# Patient Record
Sex: Male | Born: 1950 | Race: White | Hispanic: No | Marital: Married | Smoking: Former smoker
Health system: Southern US, Community
[De-identification: ages and names within clinical notes are randomized; demographics above are authoritative.]

## PROBLEM LIST (undated history)

## (undated) HISTORY — PX: ABDOMINAL SURGERY: SHX537

## (undated) HISTORY — PX: HERNIA REPAIR: SHX51

---

## 2017-12-14 ENCOUNTER — Emergency Department (HOSPITAL_BASED_OUTPATIENT_CLINIC_OR_DEPARTMENT_OTHER)
Admission: EM | Admit: 2017-12-14 | Discharge: 2017-12-14 | Disposition: A | Discharge status: Home / Self Care | Payer: BC Managed Care – PPO | Attending: Emergency Medicine | Admitting: Emergency Medicine

## 2017-12-14 ENCOUNTER — Encounter (HOSPITAL_BASED_OUTPATIENT_CLINIC_OR_DEPARTMENT_OTHER): Payer: Self-pay

## 2017-12-14 ENCOUNTER — Other Ambulatory Visit: Payer: Self-pay

## 2017-12-14 ENCOUNTER — Emergency Department (HOSPITAL_BASED_OUTPATIENT_CLINIC_OR_DEPARTMENT_OTHER): Payer: BC Managed Care – PPO

## 2017-12-14 DIAGNOSIS — Z87891 Personal history of nicotine dependence: Secondary | ICD-10-CM | POA: Insufficient documentation

## 2017-12-14 DIAGNOSIS — I10 Essential (primary) hypertension: Secondary | ICD-10-CM | POA: Insufficient documentation

## 2017-12-14 DIAGNOSIS — R000 Tachycardia, unspecified: Secondary | ICD-10-CM | POA: Insufficient documentation

## 2017-12-14 DIAGNOSIS — I700 Atherosclerosis of aorta: Secondary | ICD-10-CM | POA: Diagnosis not present

## 2017-12-14 DIAGNOSIS — M542 Cervicalgia: Secondary | ICD-10-CM | POA: Diagnosis present

## 2017-12-14 DIAGNOSIS — M5410 Radiculopathy, site unspecified: Secondary | ICD-10-CM | POA: Insufficient documentation

## 2017-12-14 MED ORDER — KETOROLAC TROMETHAMINE 15 MG/ML IJ SOLN
15.0000 mg | Freq: Once | INTRAMUSCULAR | Status: AC
  Administered 2017-12-14: 15 mg via INTRAMUSCULAR
  Filled 2017-12-14: qty 1
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
## 2017-12-14 NOTE — ED Triage Notes (Addendum)
C/o posterior neck pain, bilat shoulder blade pain x 4 days-denies injury however pain started after working on lawnmower-pain worse with movement-NAD-steady gait

## 2017-12-14 NOTE — Discharge Instructions (Addendum)
The results of your x-ray reading are attached to your discharge paperwork.  You were given a referral to a spine surgeon, you should call the office to schedule appointment for reevaluation.  You should keep your appointment with your regular doctor as well.  You may continue taking the pain medications at home to help with your symptoms.  You should return to the emergency department for any new or worsening symptoms including any chest pain, shortness of breath, weakness or numbness to your hand, or any worsening pain.

## 2018-08-28 IMAGING — CR DG CERVICAL SPINE COMPLETE 4+V
5 series · 5 of 5 positions shown · non-contrast
Comparison: None.

CLINICAL DATA: Cervicalgia with upper extremity radicular symptoms

EXAM:
CERVICAL SPINE - COMPLETE 4+ VIEW

[w c-spine a.p.]
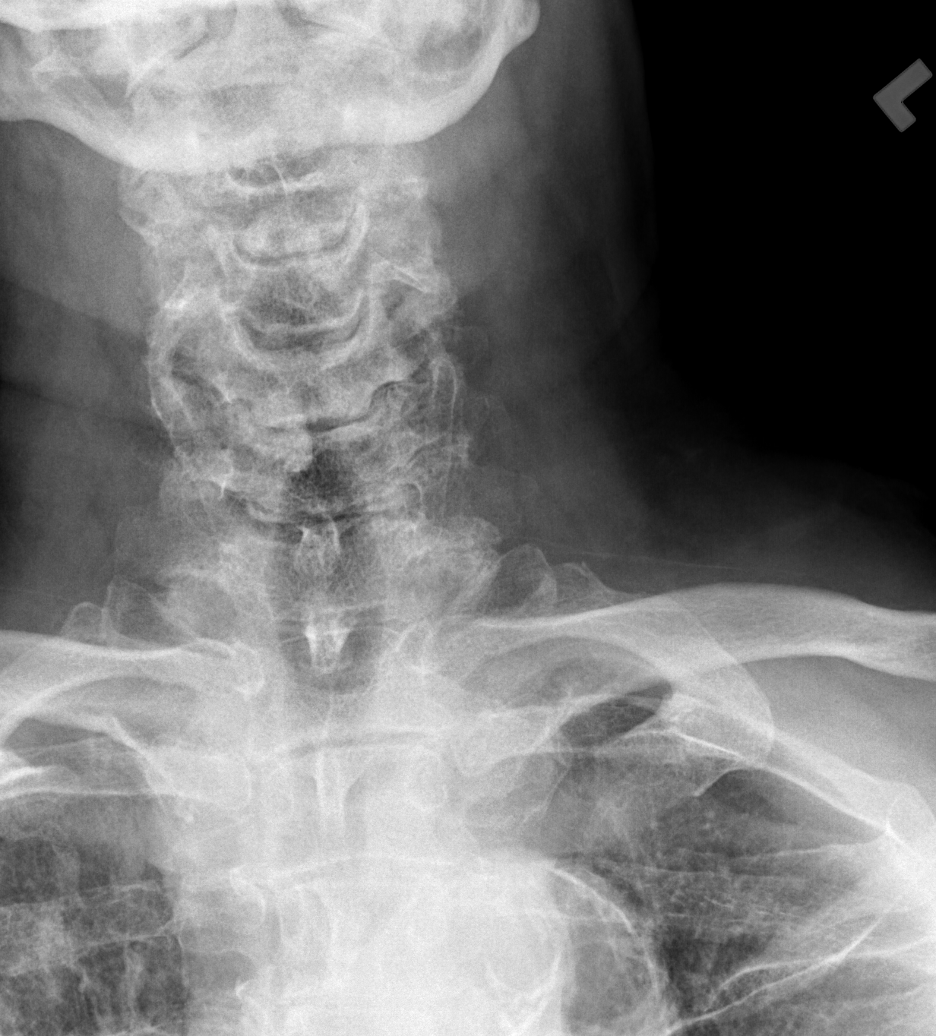

[w c-spine oblique (1 of 2)]
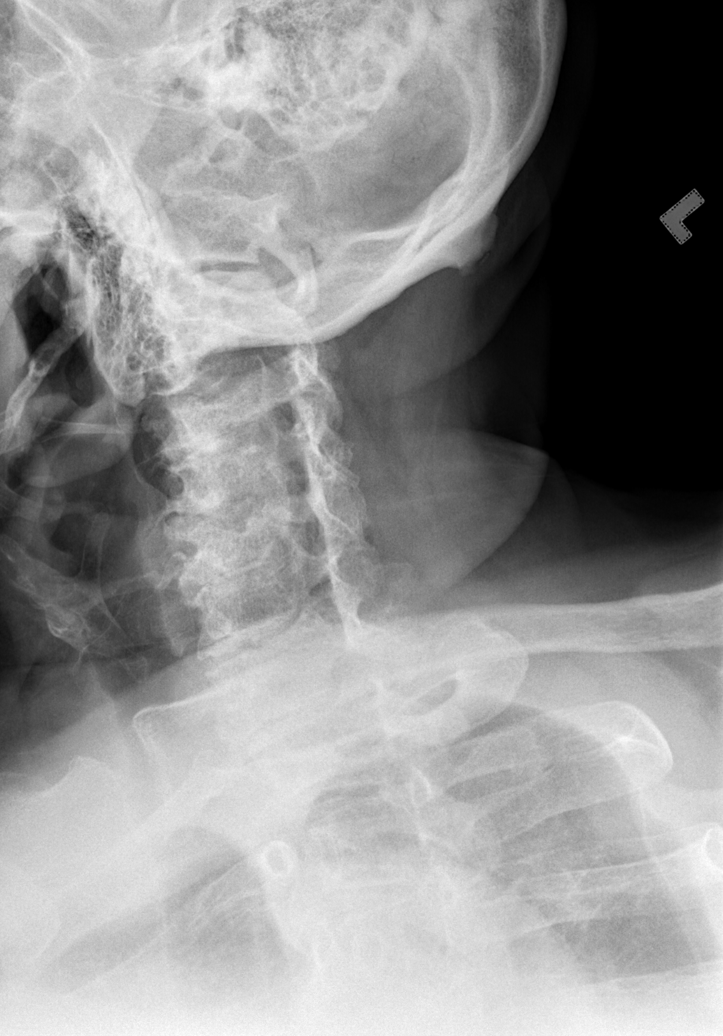

[w c-spine oblique (2 of 2)]
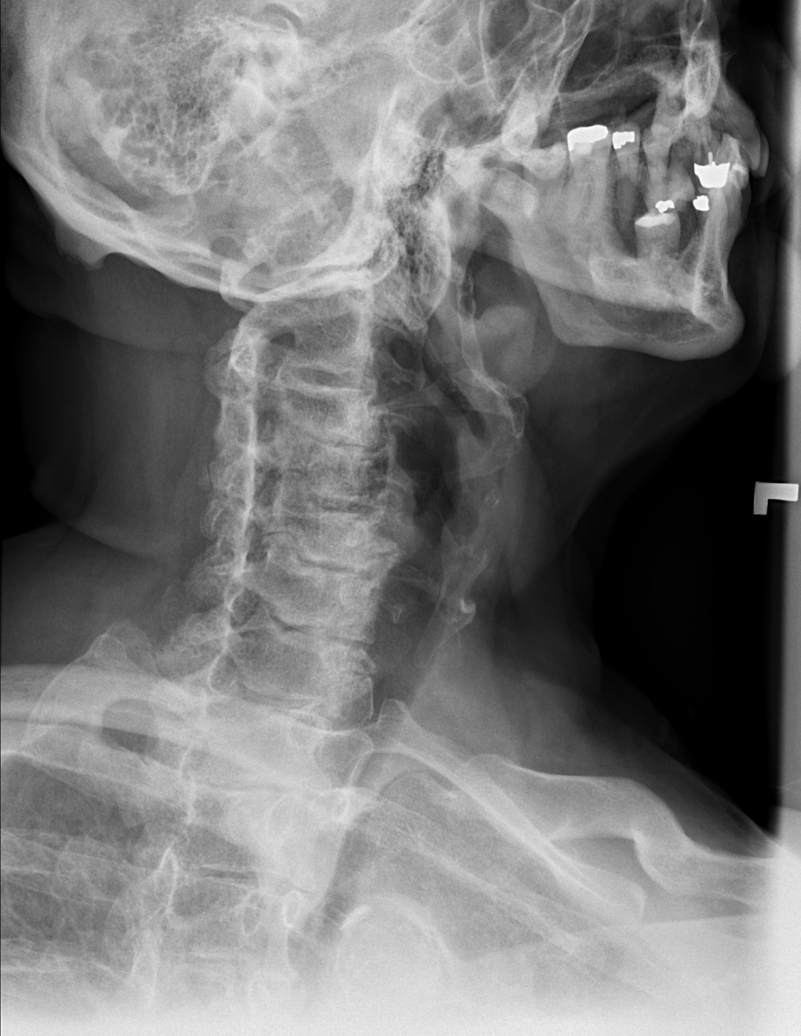

[w c-spine lat]
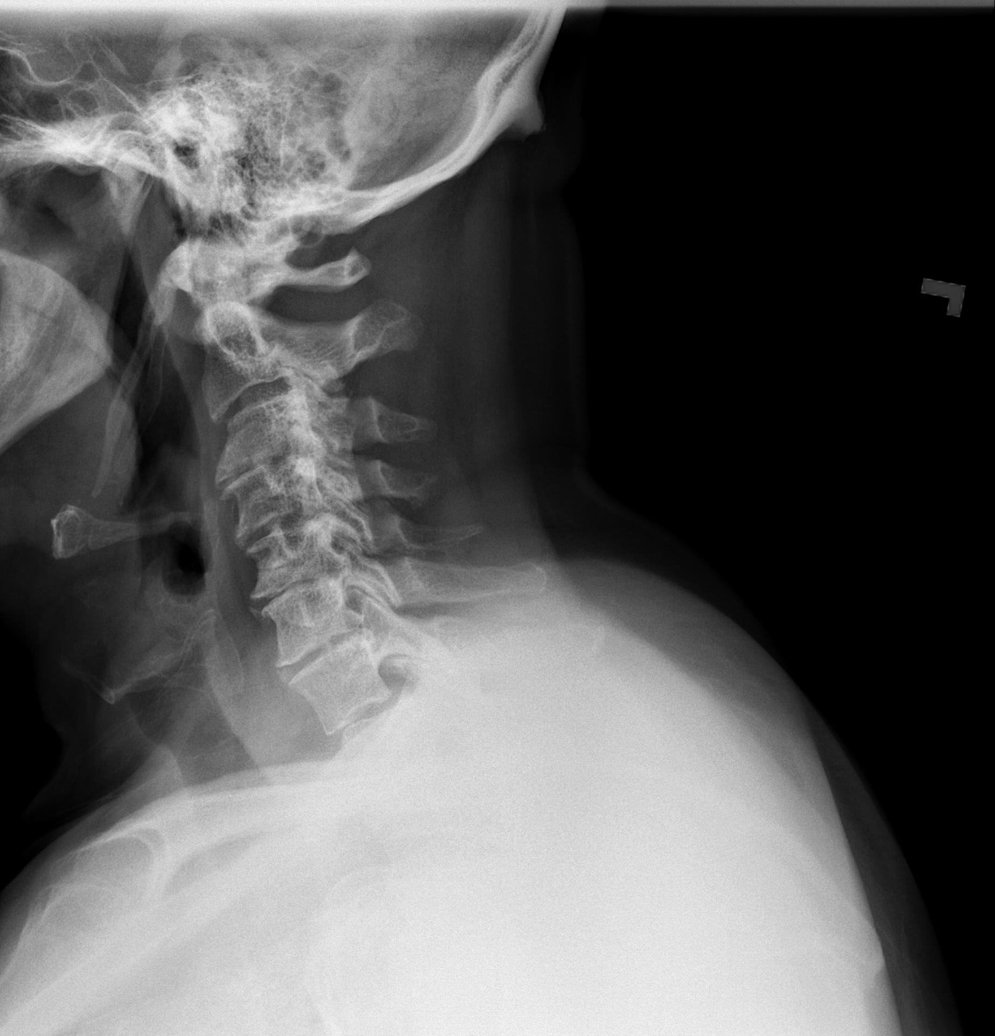

[w c-spine odontoid]
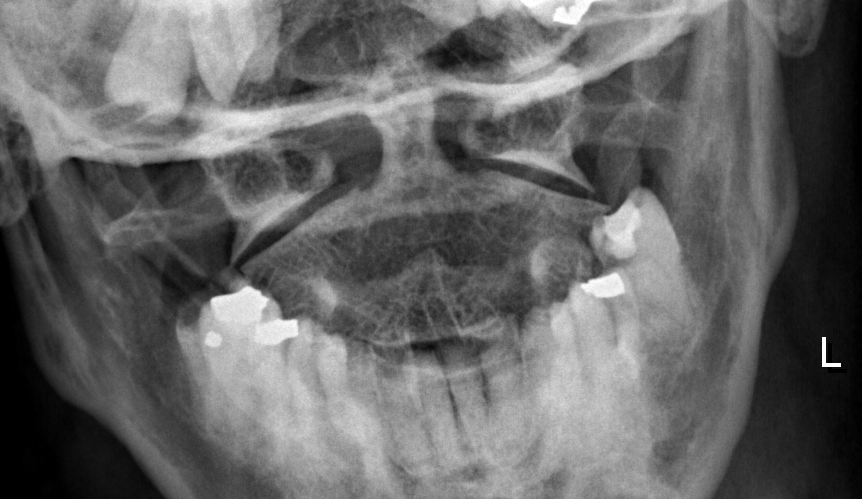

[5 of 5 positions shown; findings below may reference images not displayed]

FINDINGS: Frontal, lateral, open-mouth odontoid, and bilateral oblique views
were obtained. There is no fracture or spondylolisthesis.
Prevertebral soft tissues and predental space regions are normal.
There is severe disc space narrowing at C3-4, C5-6, and C6-7. There
is moderately severe disc space narrowing at C4-5. There are
anterior osteophytes at C2, C3, C4, C5, and C6. There is facet
hypertrophy with exit foraminal narrowing at all levels bilaterally,
most marked at C3-4, C4-5, and C5-6 bilaterally.

Lung apices are clear.  There is aortic atherosclerosis.
IMPRESSION: Multilevel arthropathy. No fracture or spondylolisthesis. There is
aortic atherosclerosis.

Aortic Atherosclerosis ([id number]).
